# Patient Record
Sex: Female | Born: 1935 | Race: White | Hispanic: No | Marital: Married
Health system: Southern US, Community
[De-identification: ages and names within clinical notes are randomized; demographics above are authoritative.]

---

## 1998-04-24 ENCOUNTER — Encounter: Payer: Self-pay | Admitting: *Deleted

## 1998-04-24 ENCOUNTER — Ambulatory Visit (HOSPITAL_COMMUNITY): Admission: RE | Admit: 1998-04-24 | Discharge: 1998-04-24 | Payer: Self-pay | Admitting: *Deleted

## 1999-05-02 ENCOUNTER — Ambulatory Visit (HOSPITAL_COMMUNITY): Admission: RE | Admit: 1999-05-02 | Discharge: 1999-05-02 | Payer: Self-pay | Admitting: *Deleted

## 1999-05-02 ENCOUNTER — Encounter: Payer: Self-pay | Admitting: *Deleted

## 2000-11-03 ENCOUNTER — Ambulatory Visit (HOSPITAL_COMMUNITY): Admission: RE | Admit: 2000-11-03 | Discharge: 2000-11-03 | Payer: Self-pay | Admitting: Family Medicine

## 2000-11-03 ENCOUNTER — Encounter: Payer: Self-pay | Admitting: Family Medicine

## 2001-11-11 ENCOUNTER — Ambulatory Visit (HOSPITAL_COMMUNITY): Admission: RE | Admit: 2001-11-11 | Discharge: 2001-11-11 | Payer: Self-pay | Admitting: Family Medicine

## 2001-11-11 ENCOUNTER — Encounter: Payer: Self-pay | Admitting: Family Medicine

## 2002-01-24 ENCOUNTER — Emergency Department (HOSPITAL_COMMUNITY): Admission: EM | Admit: 2002-01-24 | Discharge: 2002-01-24 | Payer: Self-pay | Admitting: Emergency Medicine

## 2002-01-28 ENCOUNTER — Emergency Department (HOSPITAL_COMMUNITY): Admission: EM | Admit: 2002-01-28 | Discharge: 2002-01-28 | Payer: Self-pay | Admitting: Emergency Medicine

## 2002-03-08 ENCOUNTER — Ambulatory Visit (HOSPITAL_COMMUNITY): Admission: RE | Admit: 2002-03-08 | Discharge: 2002-03-08 | Payer: Self-pay | Admitting: Gastroenterology

## 2002-12-26 ENCOUNTER — Encounter: Payer: Self-pay | Admitting: Obstetrics and Gynecology

## 2002-12-26 ENCOUNTER — Ambulatory Visit (HOSPITAL_COMMUNITY): Admission: RE | Admit: 2002-12-26 | Discharge: 2002-12-26 | Payer: Self-pay | Admitting: Obstetrics and Gynecology

## 2004-02-27 ENCOUNTER — Ambulatory Visit (HOSPITAL_COMMUNITY): Admission: RE | Admit: 2004-02-27 | Discharge: 2004-02-27 | Payer: Self-pay | Admitting: Family Medicine

## 2005-03-09 ENCOUNTER — Ambulatory Visit (HOSPITAL_COMMUNITY): Admission: RE | Admit: 2005-03-09 | Discharge: 2005-03-09 | Payer: Self-pay | Admitting: Family Medicine

## 2005-05-29 ENCOUNTER — Encounter: Admission: RE | Admit: 2005-05-29 | Discharge: 2005-05-29 | Payer: Self-pay | Admitting: Family Medicine

## 2006-04-26 ENCOUNTER — Ambulatory Visit (HOSPITAL_COMMUNITY): Admission: RE | Admit: 2006-04-26 | Discharge: 2006-04-26 | Payer: Self-pay | Admitting: Family Medicine

## 2006-11-26 IMAGING — US US RENAL
1 series · 14 of 25 positions shown · non-contrast
Comparison: none

CLINICAL DATA: Right hydronephrosis.  Polycystic kidney.
 RENAL/URINARY TRACT ULTRASOUND:
TECHNIQUE: Complete ultrasound of the urinary tract was performed including evaluation of the kidney, renal collecting systems, and urinary bladder.
 By history, the patient's prior ultrasound of the kidneys was performed at Dr. Manjula Jorgensen?[REDACTED] and is not currently available for comparison.  The right kidney measures 11.8 cm sagittally.  There is moderate to marked hydronephrosis with thin renal parenchyma.  Direct comparison with a prior ultrasound is recommended.  The left kidney measures 12.3 cm sagittally.  Only a single small cystic structure is noted in the lower pole of 8 x 5 x 8 mm.  The urinary bladder is unremarkable.

[Series 1: us renal · 0.37mm/px · 14 of 33 slices shown]
[im 1/33]
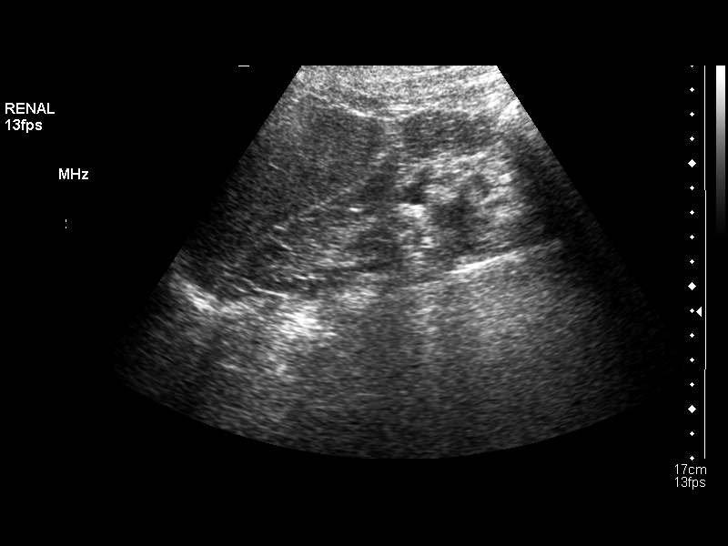
[im 3/33]
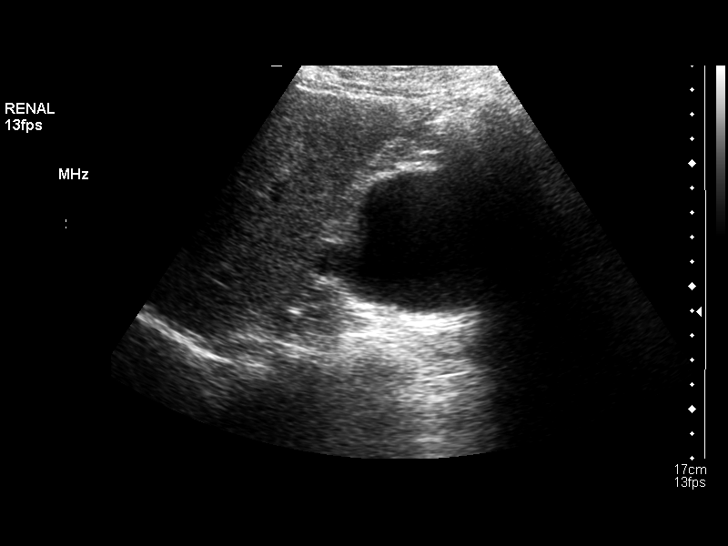
[im 6/33]
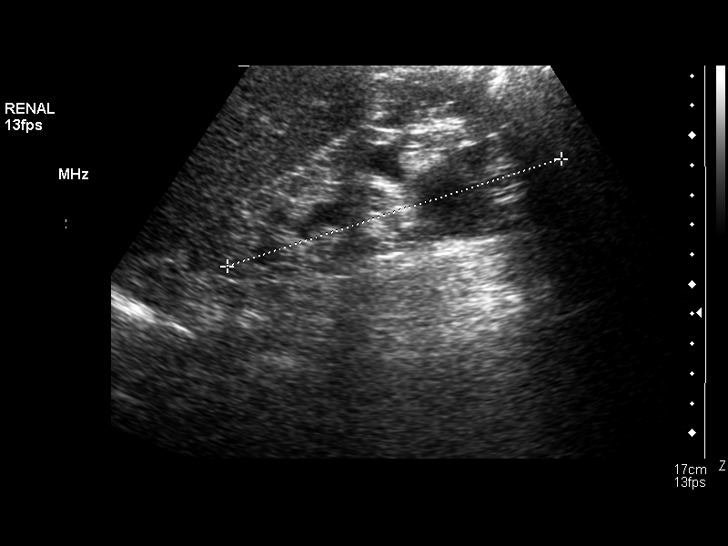
[im 9/33]
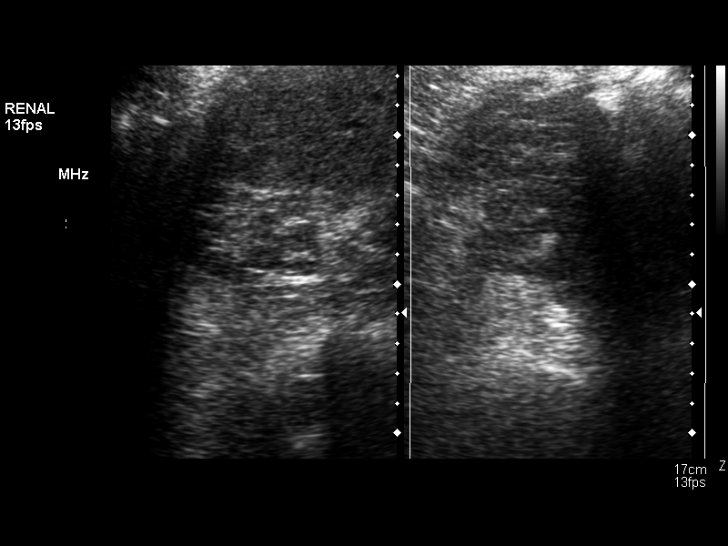
[im 11/33]
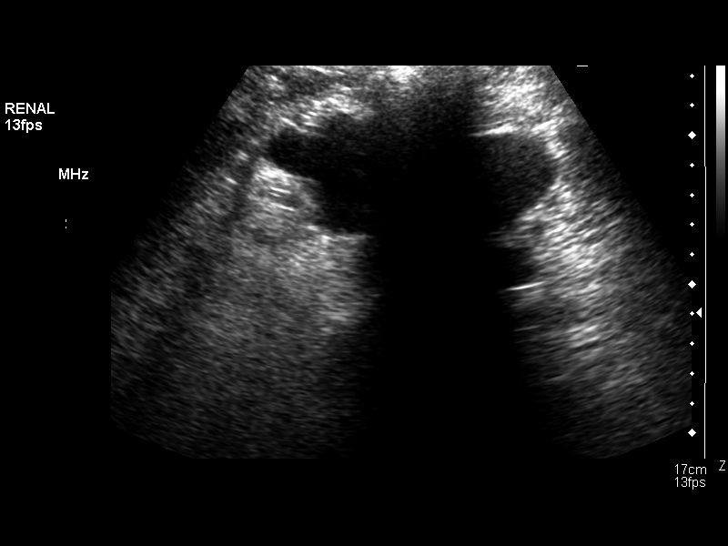
[im 13/33]
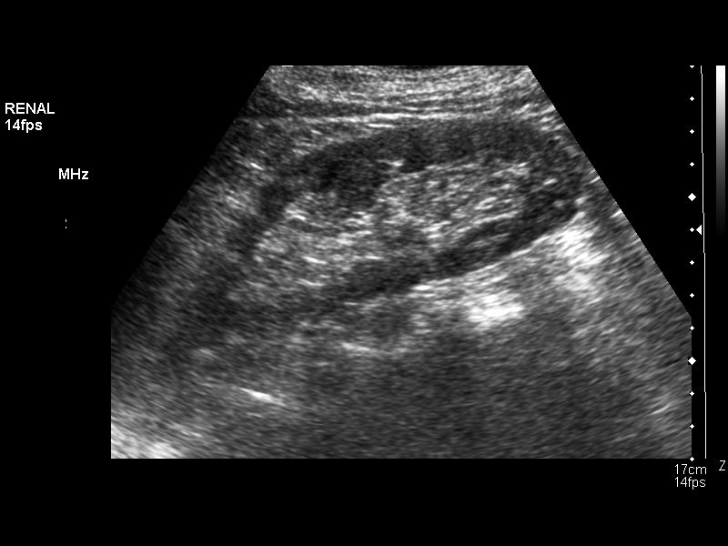
[im 15/33]
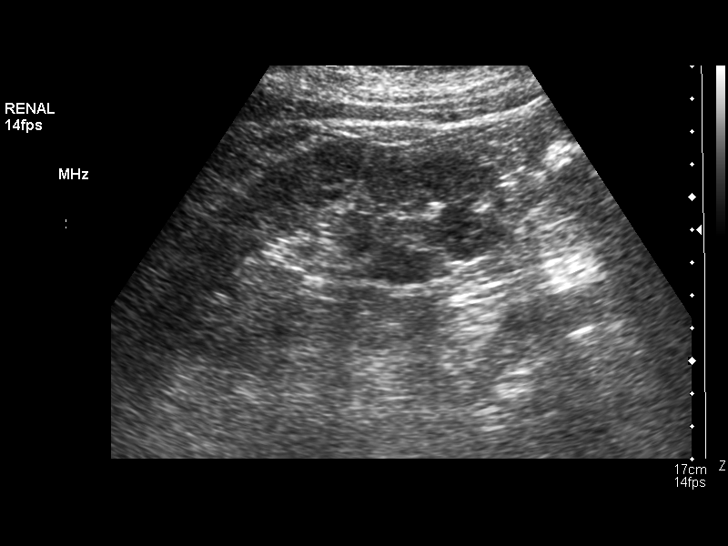
[im 18/33]
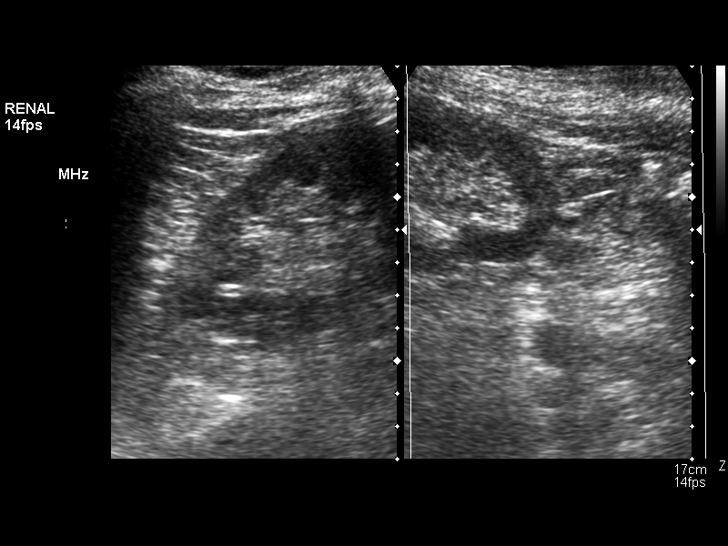
[im 21/33]
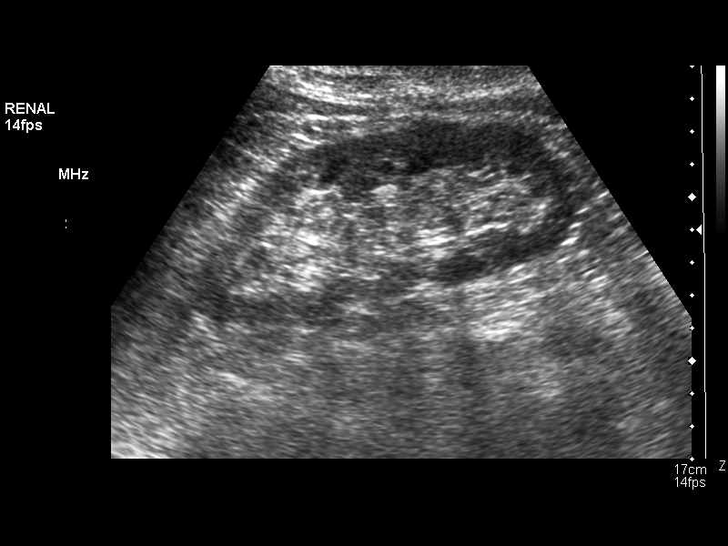
[im 22/33]
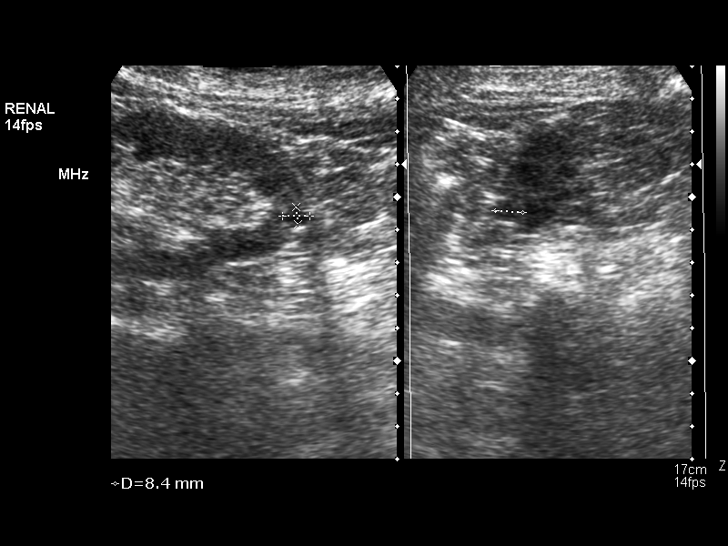
[im 25/33]
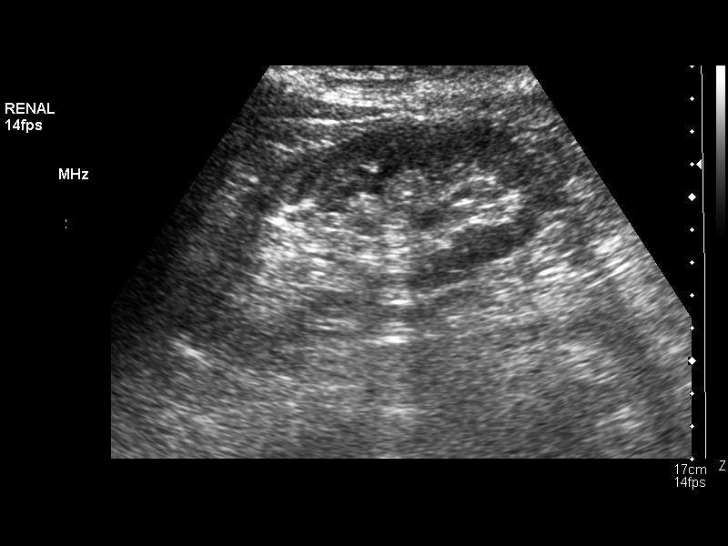
[im 27/33]
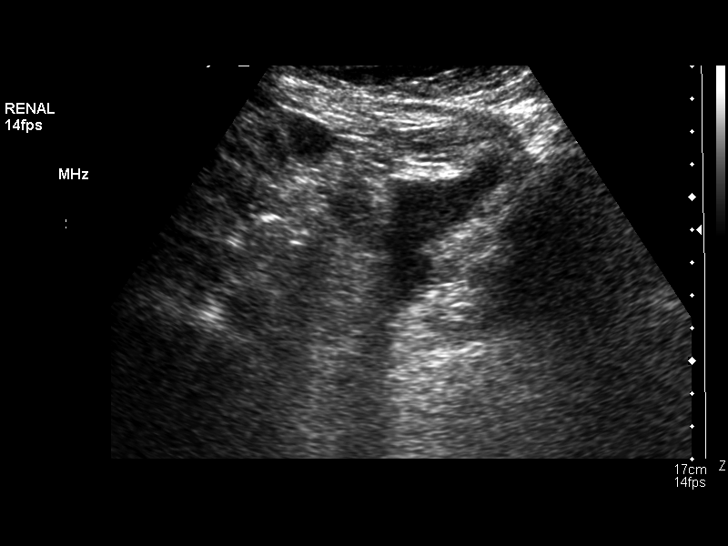
[im 30/33]
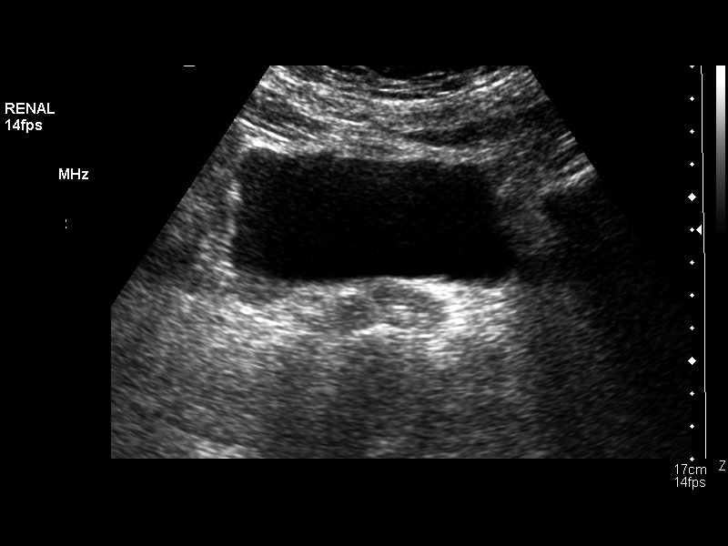
[im 33/33]
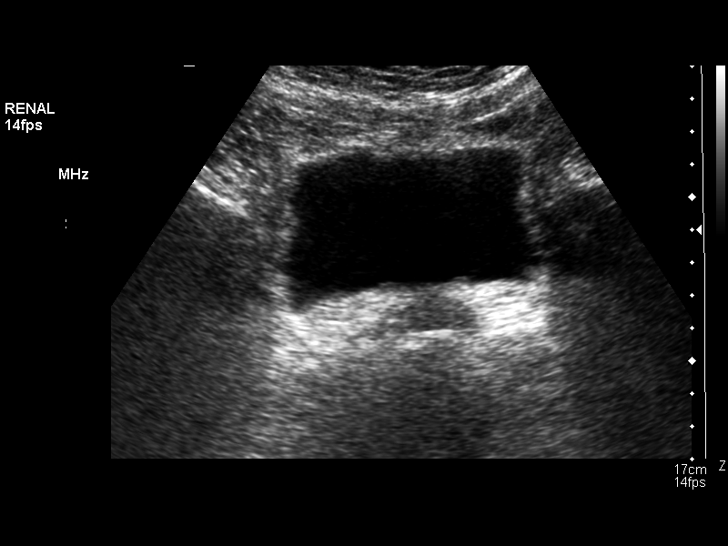

[14 of 25 positions shown; findings below may reference images not displayed]

IMPRESSION: 1.  Marked right hydronephrosis with thin renal parenchyma.  Suggest direct comparison with prior ultrasound. 
 2.  No significant abnormality noted involving the left kidney.

## 2007-04-29 ENCOUNTER — Ambulatory Visit (HOSPITAL_COMMUNITY): Admission: RE | Admit: 2007-04-29 | Discharge: 2007-04-29 | Payer: Self-pay | Admitting: Family Medicine

## 2008-05-04 ENCOUNTER — Ambulatory Visit (HOSPITAL_COMMUNITY): Admission: RE | Admit: 2008-05-04 | Discharge: 2008-05-04 | Payer: Self-pay | Admitting: Family Medicine

## 2008-12-12 ENCOUNTER — Encounter: Admission: RE | Admit: 2008-12-12 | Discharge: 2009-01-28 | Payer: Self-pay | Admitting: Family Medicine

## 2008-12-25 ENCOUNTER — Encounter: Admission: RE | Admit: 2008-12-25 | Discharge: 2008-12-25 | Payer: Self-pay | Admitting: Family Medicine

## 2009-05-13 ENCOUNTER — Ambulatory Visit (HOSPITAL_BASED_OUTPATIENT_CLINIC_OR_DEPARTMENT_OTHER): Admission: RE | Admit: 2009-05-13 | Discharge: 2009-05-13 | Payer: Self-pay | Admitting: Family Medicine

## 2009-05-13 ENCOUNTER — Ambulatory Visit: Payer: Self-pay | Admitting: Diagnostic Radiology

## 2010-05-14 ENCOUNTER — Ambulatory Visit (HOSPITAL_COMMUNITY): Admission: RE | Admit: 2010-05-14 | Discharge: 2010-05-14 | Payer: Self-pay | Admitting: Family Medicine

## 2010-11-14 NOTE — Op Note (Signed)
   Jodi Green, Jodi Green                        ACCOUNT NO.:  [id number]   MEDICAL RECORD NO.:  [id number]                   PATIENT TYPE:  AMB   LOCATION:  ENDO                                 FACILITY:  [facility]   PHYSICIAN:  Charolett Bumpers, M.D.             DATE OF BIRTH:  [birth year]   DATE OF PROCEDURE:  03/08/2002  DATE OF DISCHARGE:                                 OPERATIVE REPORT   PROCEDURE:  Screening colonoscopy.   PROCEDURE INDICATION:  The patient is a 77 year old female, born February 05, 1932.  The patient is scheduled to undergo her first screening colonoscopy  with polypectomy to prevent colon cancer.  I discussed with the patient the  complications associated with colonoscopy and polypectomy including a 15 per  1000 risk of bleeding and 4 per 1000 risk of colon perforation requiring  surgical repair.  The patient has signed the operative permit.   ENDOSCOPIST:  Charolett Bumpers, M.D.   PREMEDICATION:  Versed 5 mg, Demerol 50 mg.   ENDOSCOPE:  Olympus pediatric colonoscope.   DESCRIPTION OF PROCEDURE:  After obtaining informed consent, the patient was  placed in the left lateral decubitus position.  I administered intravenous  Demerol and intravenous Versed to achieve conscious sedation for the  procedure.  The patient's blood pressure, oxygen saturation, and cardiac  rhythm were monitored throughout the procedure and documented in the medical  record.   Anal inspection was normal.  Digital rectal exam was normal.  The Olympus  pediatric video colonoscope was introduced into the rectum and easily  advanced to the cecum.  Colonic preparation for the exam today was  excellent.   The patient has universal colonic diverticulosis without endoscopic evidence  for the presence of diverticulitis or diverticular stricture formation.   RECTUM:  Normal.  SIGMOID COLON AND DESCENDING COLON:  Normal.  SPLENIC FLEXURE:  Normal.  TRANSVERSE COLON:  Normal.  HEPATIC  FLEXURE:  Normal.  ASCENDING COLON:  Normal.  CECUM AND ILEOCECAL VALVE:  Normal.    ASSESSMENT:  Universal colonic diverticulosis; otherwise normal  proctocolonoscopy to the cecum.  No endoscopic evidence for the presence of  colorectal neoplasia.                                                Charolett Bumpers, M.D.    MKJ/MEDQ  D:  03/08/2002  T:  03/08/2002  Job:  [id number]   cc:   Raynelle Dick, M.D.  [address]  [place]  [place] [address]  Fax: [phone]

## 2011-04-23 ENCOUNTER — Other Ambulatory Visit (HOSPITAL_COMMUNITY): Payer: Self-pay | Admitting: Family Medicine

## 2011-04-23 DIAGNOSIS — Z1231 Encounter for screening mammogram for malignant neoplasm of breast: Secondary | ICD-10-CM

## 2011-05-18 ENCOUNTER — Ambulatory Visit (HOSPITAL_COMMUNITY)
Admission: RE | Admit: 2011-05-18 | Discharge: 2011-05-18 | Disposition: A | Discharge status: Home / Self Care | Payer: Medicare Other | Source: Ambulatory Visit | Attending: Family Medicine | Admitting: Family Medicine

## 2011-05-18 DIAGNOSIS — Z1231 Encounter for screening mammogram for malignant neoplasm of breast: Secondary | ICD-10-CM | POA: Insufficient documentation

## 2012-04-25 ENCOUNTER — Other Ambulatory Visit (HOSPITAL_BASED_OUTPATIENT_CLINIC_OR_DEPARTMENT_OTHER): Payer: Self-pay | Admitting: Family Medicine

## 2012-04-25 DIAGNOSIS — Z1231 Encounter for screening mammogram for malignant neoplasm of breast: Secondary | ICD-10-CM

## 2012-05-18 ENCOUNTER — Ambulatory Visit (HOSPITAL_BASED_OUTPATIENT_CLINIC_OR_DEPARTMENT_OTHER)
Admission: RE | Admit: 2012-05-18 | Discharge: 2012-05-18 | Disposition: A | Discharge status: Home / Self Care | Payer: Medicare Other | Source: Ambulatory Visit | Attending: Family Medicine | Admitting: Family Medicine

## 2012-05-18 DIAGNOSIS — Z1231 Encounter for screening mammogram for malignant neoplasm of breast: Secondary | ICD-10-CM | POA: Insufficient documentation

## 2012-07-19 ENCOUNTER — Other Ambulatory Visit: Payer: Self-pay | Admitting: Family Medicine

## 2012-07-19 DIAGNOSIS — Z780 Asymptomatic menopausal state: Secondary | ICD-10-CM

## 2012-08-11 ENCOUNTER — Other Ambulatory Visit: Payer: Medicare Other

## 2012-08-18 ENCOUNTER — Ambulatory Visit
Admission: RE | Admit: 2012-08-18 | Discharge: 2012-08-18 | Disposition: A | Discharge status: Home / Self Care | Payer: Medicare Other | Source: Ambulatory Visit | Attending: Family Medicine | Admitting: Family Medicine

## 2012-08-18 DIAGNOSIS — Z780 Asymptomatic menopausal state: Secondary | ICD-10-CM

## 2013-05-12 ENCOUNTER — Other Ambulatory Visit (HOSPITAL_BASED_OUTPATIENT_CLINIC_OR_DEPARTMENT_OTHER): Payer: Self-pay | Admitting: Family Medicine

## 2013-05-12 DIAGNOSIS — Z1231 Encounter for screening mammogram for malignant neoplasm of breast: Secondary | ICD-10-CM

## 2013-05-31 ENCOUNTER — Ambulatory Visit (HOSPITAL_BASED_OUTPATIENT_CLINIC_OR_DEPARTMENT_OTHER)
Admission: RE | Admit: 2013-05-31 | Discharge: 2013-05-31 | Disposition: A | Discharge status: Home / Self Care | Payer: Medicare Other | Source: Ambulatory Visit | Attending: Family Medicine | Admitting: Family Medicine

## 2013-05-31 DIAGNOSIS — Z1231 Encounter for screening mammogram for malignant neoplasm of breast: Secondary | ICD-10-CM | POA: Insufficient documentation

## 2013-11-15 IMAGING — MG MM DIGITAL SCREENING BILAT W/ CAD
4 series · 4 of 4 positions shown · non-contrast
Comparison: Previous exams.

CLINICAL DATA: Screening.

DIGITAL BILATERAL SCREENING MAMMOGRAM WITH CAD

[R CC]
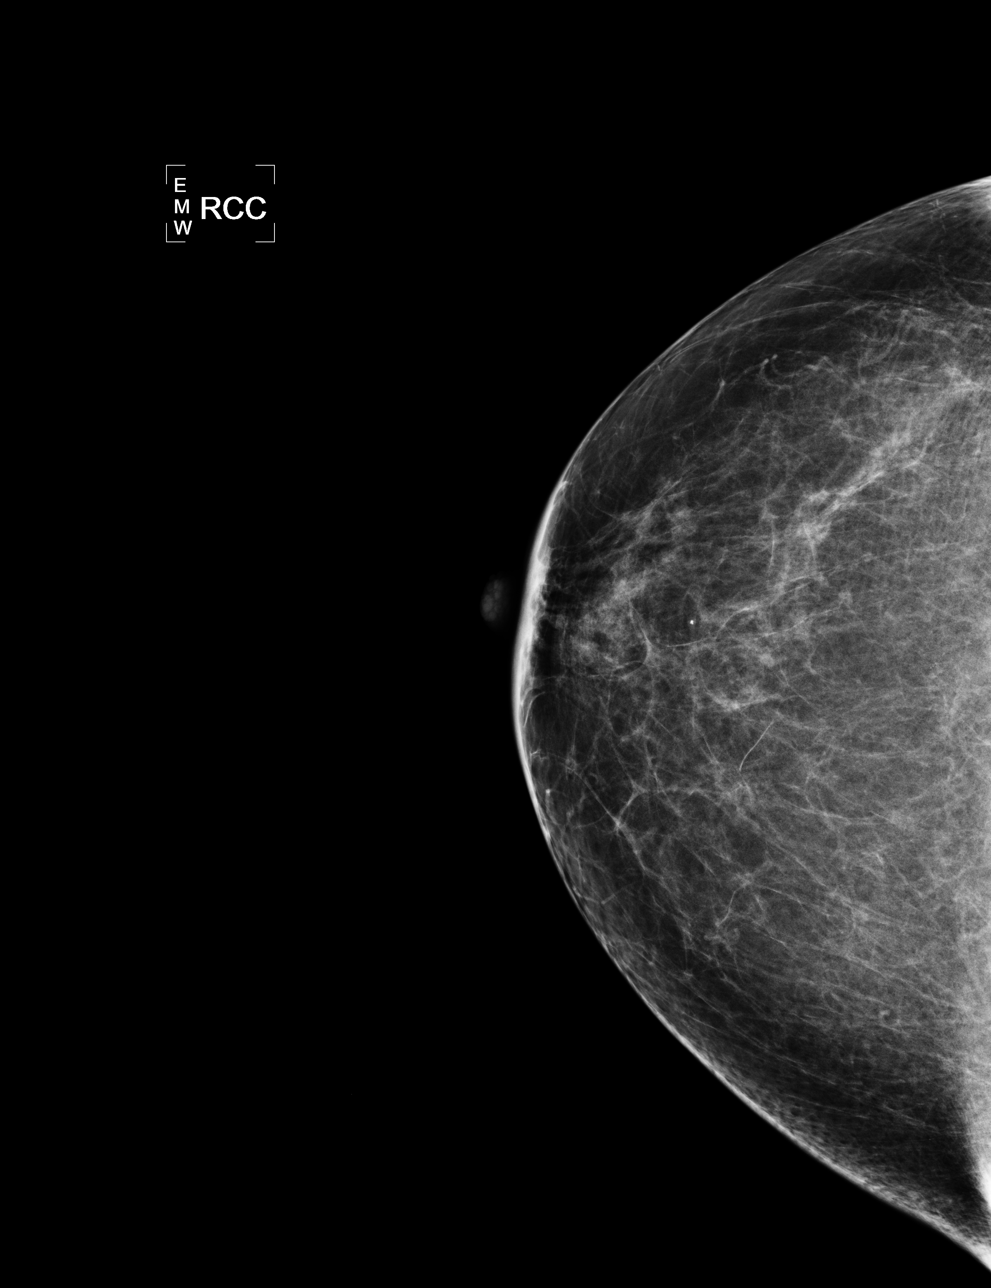

[L CC]
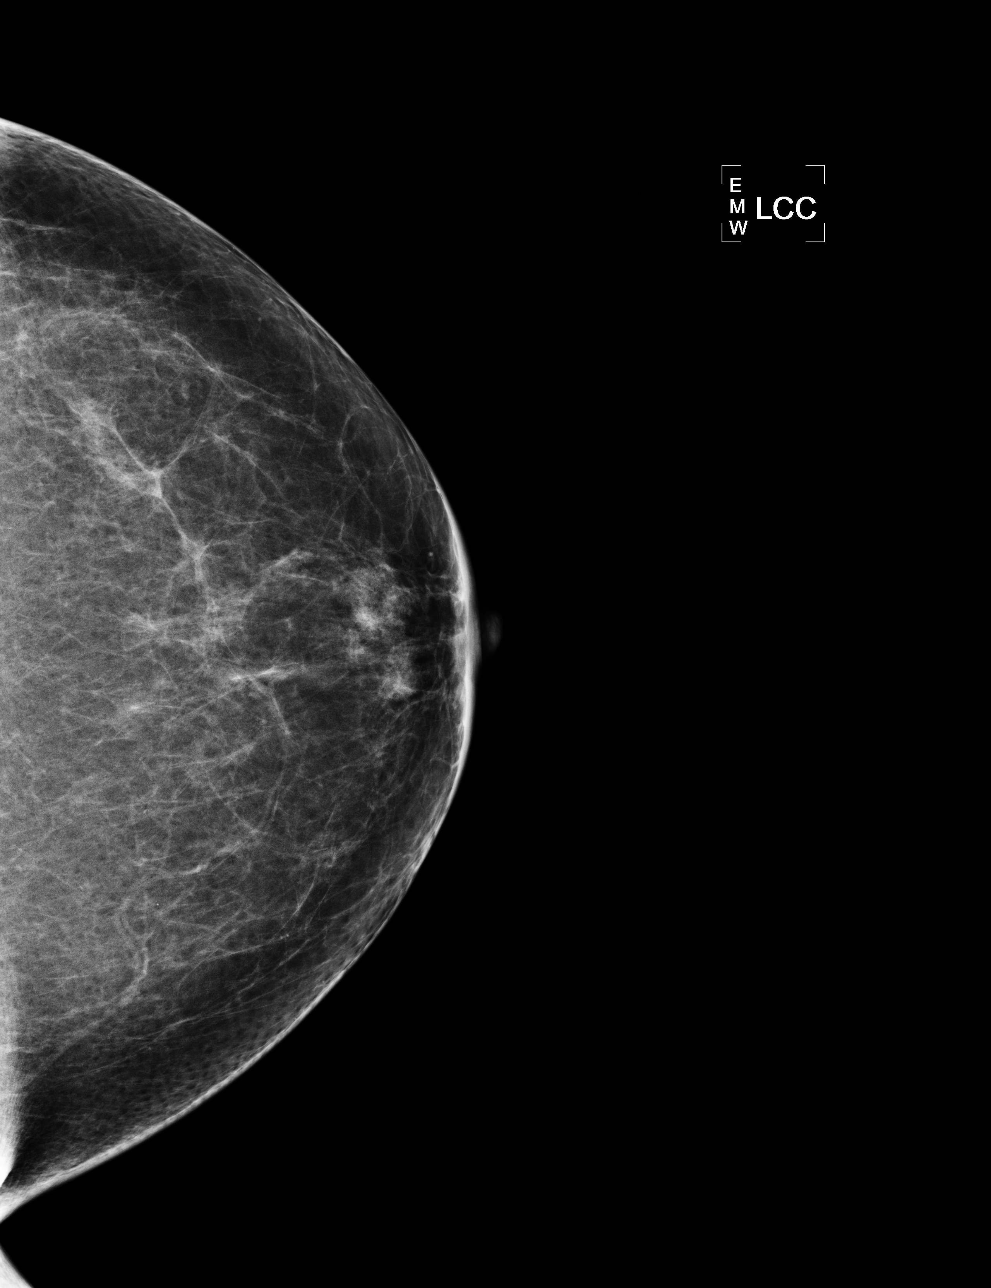

[L MLO]
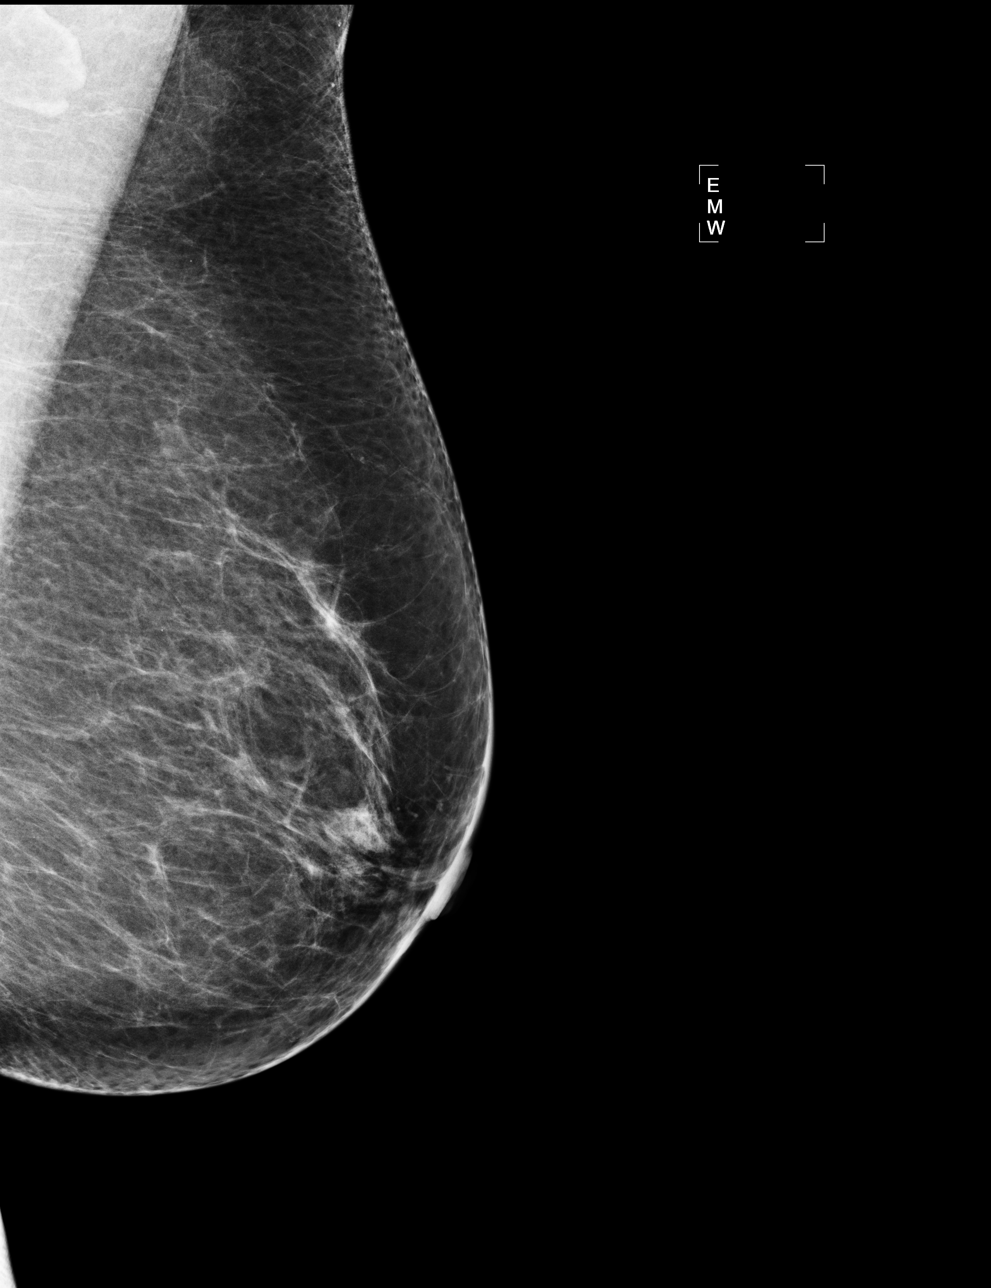

[R MLO]
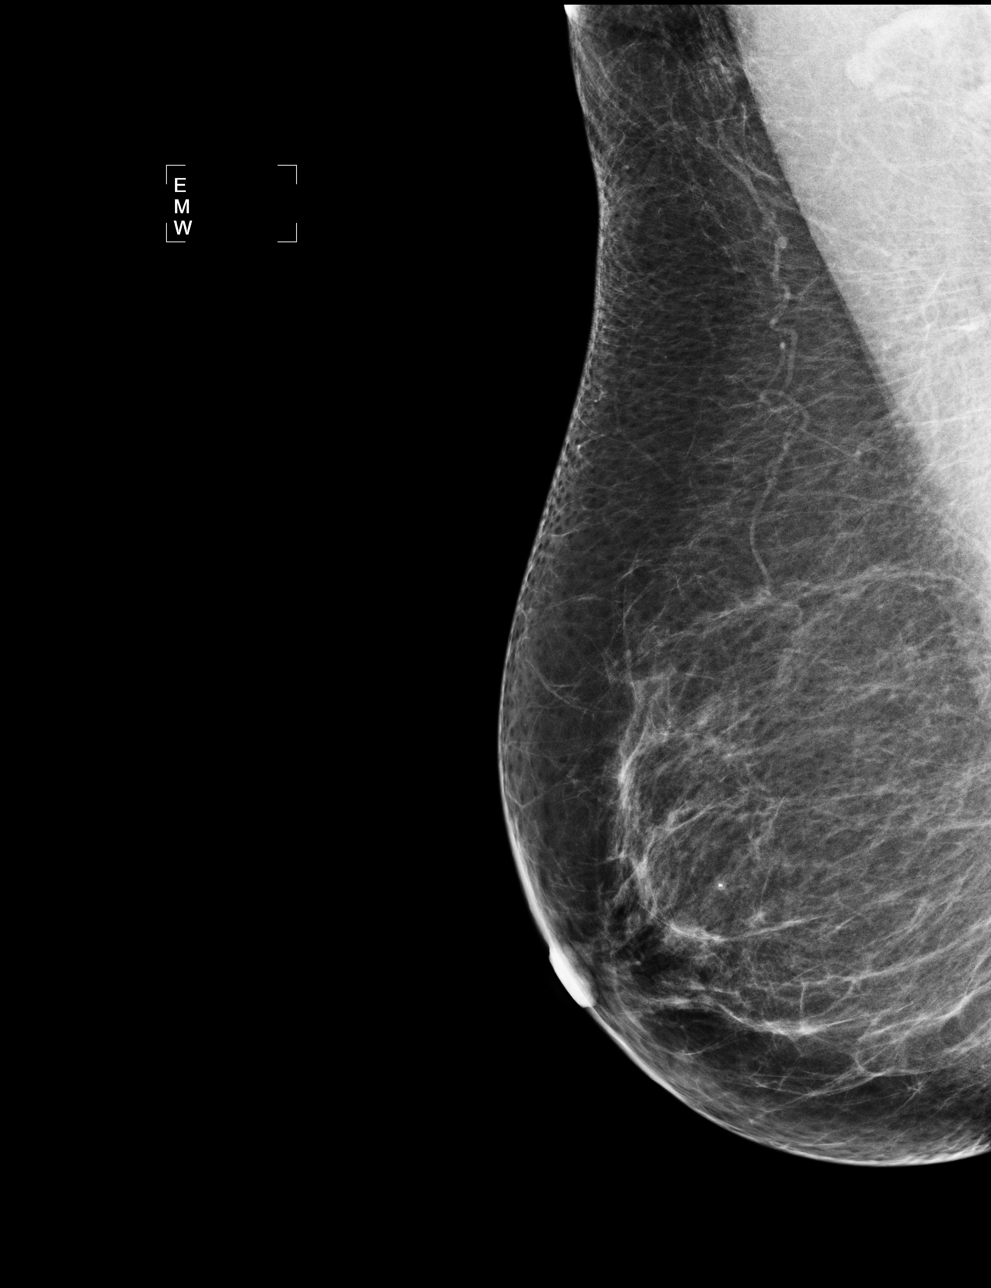

[4 of 4 positions shown; findings below may reference images not displayed]

FINDINGS: There are scattered fibroglandular densities. No
suspicious masses, architectural distortion, or calcifications are
present.

Images were processed with CAD.
IMPRESSION: No mammographic evidence of malignancy.

A result letter of this screening mammogram will be mailed directly
to the patient.

RECOMMENDATION:
Screening mammogram in one year. (Code:FV-O-IDP)

BI-RADS CATEGORY 1:  Negative.
